# Patient Record
Sex: Male | Born: 2005 | Hispanic: Yes | Marital: Single | Smoking: Never smoker
Health system: Southern US, Community
[De-identification: ages and names within clinical notes are randomized; demographics above are authoritative.]

---
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
## 2024-01-26 ENCOUNTER — Ambulatory Visit: Payer: Self-pay | Admitting: Pediatrics

## 2024-01-31 ENCOUNTER — Encounter: Payer: Self-pay | Admitting: Pediatrics

## 2024-01-31 ENCOUNTER — Ambulatory Visit (INDEPENDENT_AMBULATORY_CARE_PROVIDER_SITE_OTHER): Payer: Self-pay | Admitting: Pediatrics

## 2024-01-31 VITALS — BP 118/62 | HR 67 | Temp 97.9°F | Ht 70.0 in | Wt 146.0 lb

## 2024-01-31 DIAGNOSIS — Z68.41 Body mass index (BMI) pediatric, 5th percentile to less than 85th percentile for age: Secondary | ICD-10-CM

## 2024-01-31 DIAGNOSIS — Z113 Encounter for screening for infections with a predominantly sexual mode of transmission: Secondary | ICD-10-CM

## 2024-01-31 DIAGNOSIS — Z00121 Encounter for routine child health examination with abnormal findings: Secondary | ICD-10-CM

## 2024-01-31 DIAGNOSIS — L700 Acne vulgaris: Secondary | ICD-10-CM | POA: Diagnosis not present

## 2024-01-31 DIAGNOSIS — Z0001 Encounter for general adult medical examination with abnormal findings: Secondary | ICD-10-CM

## 2024-01-31 NOTE — Progress Notes (Signed)
 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 Happy at home  Sleep: Sleeps usually 8-9hrs on week days; no snoring.   Up to date on dental visit No past medical history on file. Past Surgical History:  Procedure Laterality Date   LAPAROSCOPIC APPENDECTOMY N/A 08/23/2014   Procedure: APPENDECTOMY LAPAROSCOPIC;  Surgeon: CHRISTELLA. Julietta Millman, MD;  Location: [facility];  Service: Pediatrics;  Laterality: N/A;   No current outpatient medications on file prior to visit.   No current facility-administered medications on file prior to visit.     ROS: see HPI Hearing Screening   500Hz  1000Hz  2000Hz  3000Hz  4000Hz   Right ear 20 20 20 20 20   Left ear 20 20 20 20 20    Vision Screening   Right eye Left eye Both eyes  Without correction 20/50 20/100 20/40  With correction     Comments: Does have glasses but does not wear them   Objective:   Wt Readings from Last 3 Encounters:  01/31/24 146 lb (66.2 kg) (40%, Z= -0.25)*  03/24/23 142 lb (64.4 kg) (40%, Z= -0.26)*  12/01/22 142 lb 12.8 oz (64.8 kg) (44%, Z= -0.16)*   * Growth percentiles are based on CDC (Boys, 2-20 Years) data.   Temp Readings from Last 3 Encounters:  01/31/24 97.9 F (36.6 C) (Temporal)  03/24/23 98.2 F (36.8 C)  10/03/22 98 F (36.7 C) (Oral)   BP Readings from Last 3 Encounters:  01/31/24 118/62   03/24/23 114/68 (34%, Z = -0.41 /  45%, Z = -0.13)*  12/01/22 (!) 110/62 (24%, Z = -0.71 /  26%, Z = -0.64)*   *BP percentiles are based on the 2017 AAP Clinical Practice Guideline for boys   Pulse Readings from Last 3 Encounters:  01/31/24 67  03/24/23 66  12/01/22 80     General:   Well-appearing, no acute distress  Head NCAT.  Skin:   Moist mucus membranes. No rashes  Oropharynx:   Lips, mucosa and tongue normal. No erythema or exudates in pharynx. Normal dentition  Eyes:   sclerae white, pupils equal and reactive to light and accomodation, red reflex normal bilaterally. EOMI  Ears:   Tms: wnl. Normal outer ear  Nare Normal nasal turbinates  Neck:   normal, supple, no thyromegaly, no cervical LAD  Lungs:  GAE b/l. CTA b/l. No w/r/r  Heart:   S1, S2. RRR. No m/r/g  Breast No discharge.   Abdomen:  Soft, NDNT, no masses, no guarding or rigidity. Normal bowel sounds. No hepatosplenomegaly  Musculoskel No scoliosis  GU:  Testicles descended x 2, circumcised, tanner  Extremities:   FROM x 4.  Neuro:  CN II-XII grossly intact, normal gait, normal sensation, normal strength, normal gait    Assessment:  18 y/o male here for WCV. No complaints Normal development. Normal growth Graduated HS.  Good intake and out put Denies sexual activity, drug or alcohol use. Stable social situation  30 %ile (Z= -0.53) based on CDC (Boys, 2-20 Years) BMI-for-age based on BMI available on 01/31/2024.  BMI wnl PHQ wnl Passed hearing  Failed vision: wears glasses-didn't wear them today   Plan:  1.WCV: No CT/GC-pt denies sexual activity Anticipatory guidance discussed in re healthy diet, one hour daily exercise, limit screen time to 2 hours daily, seatbelt and helmet safety. Future career goals planning, safe sex, abstinence and avoiding toxic habits and substances. Follow-up in one year for WCV   2. Acne: improving. Pt has it under control with skin care and moisturizing

## 2024-03-01 ENCOUNTER — Emergency Department (HOSPITAL_COMMUNITY)

## 2024-03-01 ENCOUNTER — Other Ambulatory Visit: Payer: Self-pay

## 2024-03-01 ENCOUNTER — Encounter (HOSPITAL_COMMUNITY): Payer: Self-pay

## 2024-03-01 ENCOUNTER — Emergency Department (HOSPITAL_COMMUNITY)
Admission: EM | Admit: 2024-03-01 | Discharge: 2024-03-01 | Disposition: A | Discharge status: Home / Self Care | Attending: Emergency Medicine | Admitting: Emergency Medicine

## 2024-03-01 DIAGNOSIS — R112 Nausea with vomiting, unspecified: Secondary | ICD-10-CM | POA: Insufficient documentation

## 2024-03-01 DIAGNOSIS — Z8673 Personal history of transient ischemic attack (TIA), and cerebral infarction without residual deficits: Secondary | ICD-10-CM | POA: Insufficient documentation

## 2024-03-01 DIAGNOSIS — R519 Headache, unspecified: Secondary | ICD-10-CM | POA: Diagnosis not present

## 2024-03-01 DIAGNOSIS — I10 Essential (primary) hypertension: Secondary | ICD-10-CM | POA: Insufficient documentation

## 2024-03-01 DIAGNOSIS — Z79899 Other long term (current) drug therapy: Secondary | ICD-10-CM | POA: Insufficient documentation

## 2024-03-01 MED ORDER — DIPHENHYDRAMINE HCL 50 MG/ML IJ SOLN
25.0000 mg | Freq: Once | INTRAMUSCULAR | Status: AC
  Administered 2024-03-01: 25 mg via INTRAVENOUS
  Filled 2024-03-01: qty 1

## 2024-03-01 MED ORDER — PROCHLORPERAZINE EDISYLATE 10 MG/2ML IJ SOLN
10.0000 mg | Freq: Once | INTRAMUSCULAR | Status: AC
  Administered 2024-03-01: 10 mg via INTRAVENOUS
  Filled 2024-03-01: qty 2

## 2024-03-01 MED ORDER — SODIUM CHLORIDE 0.9 % IV BOLUS
1000.0000 mL | Freq: Once | INTRAVENOUS | Status: AC
  Administered 2024-03-01: 1000 mL via INTRAVENOUS

## 2024-03-01 MED ORDER — DEXAMETHASONE SODIUM PHOSPHATE 10 MG/ML IJ SOLN
10.0000 mg | Freq: Once | INTRAMUSCULAR | Status: AC
  Administered 2024-03-01: 10 mg via INTRAVENOUS
  Filled 2024-03-01: qty 1

## 2024-03-01 MED ORDER — KETOROLAC TROMETHAMINE 30 MG/ML IJ SOLN: 15.0000 mg | Freq: Once | INTRAMUSCULAR | Status: DC

## 2024-03-01 NOTE — ED Provider Notes (Signed)
 [place] EMERGENCY DEPARTMENT AT [facility] Provider Note   CSN: [id number] Arrival date & time: 03/01/24  9887     Patient presents with: Migraine   Carlos Zavala is a 18 y.o. male.   Patient presents to the emergency department for evaluation of headache.  Patient reports that his head started hurting around 10 PM and has progressively worsened.  He has now developed light sensitivity, recurrent nausea and vomiting.  Patient reports diffuse pain across the forehead area.  No history of migraines.  His mother does have history of migraines.       Prior to Admission medications   Not on File    Allergies: Patient has no known allergies.    Review of Systems  Updated Vital Signs BP 122/76   Pulse 64   Resp 18   Ht 5' 10 (1.778 m)   SpO2 99%   BMI 20.95 kg/m   Physical Exam Vitals and nursing note reviewed.  Constitutional:      General: He is not in acute distress.    Appearance: He is well-developed.  HENT:     Head: Normocephalic and atraumatic.     Mouth/Throat:     Mouth: Mucous membranes are moist.  Eyes:     General: Vision grossly intact. Gaze aligned appropriately.     Extraocular Movements: Extraocular movements intact.     Conjunctiva/sclera: Conjunctivae normal.  Cardiovascular:     Rate and Rhythm: Normal rate and regular rhythm.     Pulses: Normal pulses.     Heart sounds: Normal heart sounds, S1 normal and S2 normal. No murmur heard.    No friction rub. No gallop.  Pulmonary:     Effort: Pulmonary effort is normal. No respiratory distress.     Breath sounds: Normal breath sounds.  Abdominal:     Palpations: Abdomen is soft.     Tenderness: There is no abdominal tenderness. There is no guarding or rebound.     Hernia: No hernia is present.  Musculoskeletal:        General: No swelling.     Cervical back: Full passive range of motion without pain, normal range of motion and neck supple. No pain with movement, spinous  process tenderness or muscular tenderness. Normal range of motion.     Right lower leg: No edema.     Left lower leg: No edema.  Skin:    General: Skin is warm and dry.     Capillary Refill: Capillary refill takes less than 2 seconds.     Findings: No ecchymosis, erythema, lesion or wound.  Neurological:     Mental Status: He is alert and oriented to person, place, and time.     GCS: GCS eye subscore is 4. GCS verbal subscore is 5. GCS motor subscore is 6.     Cranial Nerves: Cranial nerves 2-12 are intact.     Sensory: Sensation is intact.     Motor: Motor function is intact. No weakness or abnormal muscle tone.     Coordination: Coordination is intact.  Psychiatric:        Mood and Affect: Mood normal.        Speech: Speech normal.        Behavior: Behavior normal.     (all labs ordered are listed, but only abnormal results are displayed) Labs Reviewed - No data to display  EKG: None  Radiology: CT HEAD WO CONTRAST ( ) Result Date: 03/01/2024 CLINICAL DATA:  Initial  evaluation for acute headache. EXAM: CT HEAD WITHOUT CONTRAST TECHNIQUE: Contiguous axial images were obtained from the base of the skull through the vertex without intravenous contrast. RADIATION DOSE REDUCTION: This exam was performed according to the departmental dose-optimization program which includes automated exposure control, adjustment of the mA and/or kV according to patient size and/or use of iterative reconstruction technique. COMPARISON:  None Available. FINDINGS: Brain: Cerebral volume within normal limits for patient age. No acute intracranial hemorrhage. No acute large vessel territory infarct. No mass lesion, midline shift, or mass effect. Ventricles are normal in size without hydrocephalus. No extra-axial fluid collection. Vascular: No abnormal hyperdense vessel. Skull: Scalp soft tissues demonstrate no acute abnormality. Calvarium intact. Sinuses/Orbits: Globes and orbital soft tissues within normal  limits. Visualized paranasal sinuses are largely clear. No significant mastoid effusion. IMPRESSION: Normal head CT. No acute intracranial abnormality. Electronically Signed   By: Morene Hoard M.D.   On: 03/01/2024 02:32     Procedures   Medications Ordered in the ED  ketorolac  (TORADOL ) 30 MG/ML injection 15 mg (has no administration in time range)  sodium chloride  0.9 % bolus 1,000 mL (1,000 mLs Intravenous New Bag/Given 03/01/24 0227)  dexamethasone  (DECADRON ) injection 10 mg (10 mg Intravenous Given 03/01/24 0201)  prochlorperazine  (COMPAZINE ) injection 10 mg (10 mg Intravenous Given 03/01/24 0201)  diphenhydrAMINE  (BENADRYL ) injection 25 mg (25 mg Intravenous Given 03/01/24 0204)                                    Medical Decision Making Amount and/or Complexity of Data Reviewed Radiology: ordered.  Risk Prescription drug management.   Differential Diagnosis considered includes, but not limited to: Hypertensive emergencies, Idiopathic intracranial hypertension, Space occupying lesions (tumors, abscesses, cysts), Acute hydrocephalus, Dural sinus thrombosis, Intracranial hemorrhage, Cerebrovascular accident or stroke, Meningitis and encephalitis, Migraine HA, Tension HA.  Patient presents to the emergency department for evaluation of headache.  Patient complaining of severe headache, light sensitivity, nausea and vomiting.  He does not have a personal history of migraines but there is a family history.  Headache does seem consistent with a migraine.  Since this is his first visit for this, CT head was performed.  This was performed within 6 hours of onset of the headache and is normal.  Headache gradually worsened, no red flags, doubt subarachnoid hemorrhage.  Patient administered migraine cocktail and his headache has completely resolved.  Will discharge, counseled that he may need PCP follow-up.  He may have future migraines and may need referral to neurology if that  occurs.     Final diagnoses:  Bad headache    ED Discharge Orders     None          Valin Massie, Lonni PARAS, MD 03/01/24 [phone]

## 2024-03-01 NOTE — ED Notes (Signed)
 Pt had several emesis episodes immediately after IV administration. EDP made aware

## 2024-03-01 NOTE — ED Triage Notes (Signed)
 Pt arrives from home.Migraine started at 10pm and has progressively gotten worse. . On and off emesis episodes. Denies blurry vision. Sensitive to light.

## 2024-04-05 ENCOUNTER — Encounter: Payer: Self-pay | Admitting: *Deleted
